# Patient Record
Sex: Female | Born: 1970 | Race: White | Hispanic: No | Marital: Married | Smoking: Never smoker
Health system: Southern US, Community
[De-identification: ages and names within clinical notes are randomized; demographics above are authoritative.]

## PROBLEM LIST (undated history)

## (undated) DIAGNOSIS — I10 Essential (primary) hypertension: Secondary | ICD-10-CM

## (undated) DIAGNOSIS — R011 Cardiac murmur, unspecified: Secondary | ICD-10-CM

## (undated) HISTORY — DX: Essential (primary) hypertension: I10

## (undated) HISTORY — PX: TUBAL LIGATION: SHX77

## (undated) HISTORY — PX: ABDOMINAL HYSTERECTOMY: SHX81

## (undated) HISTORY — DX: Cardiac murmur, unspecified: R01.1

---

## 2011-01-15 ENCOUNTER — Inpatient Hospital Stay (INDEPENDENT_AMBULATORY_CARE_PROVIDER_SITE_OTHER)
Admission: RE | Admit: 2011-01-15 | Discharge: 2011-01-15 | Disposition: A | Discharge status: Home / Self Care | Payer: PRIVATE HEALTH INSURANCE | Source: Ambulatory Visit | Attending: Emergency Medicine | Admitting: Emergency Medicine

## 2011-01-15 ENCOUNTER — Ambulatory Visit (INDEPENDENT_AMBULATORY_CARE_PROVIDER_SITE_OTHER): Payer: PRIVATE HEALTH INSURANCE

## 2011-01-15 DIAGNOSIS — W19XXXA Unspecified fall, initial encounter: Secondary | ICD-10-CM

## 2011-01-15 DIAGNOSIS — S9000XA Contusion of unspecified ankle, initial encounter: Secondary | ICD-10-CM

## 2011-01-15 DIAGNOSIS — S9030XA Contusion of unspecified foot, initial encounter: Secondary | ICD-10-CM

## 2012-09-19 ENCOUNTER — Other Ambulatory Visit: Payer: Self-pay | Admitting: Family Medicine

## 2012-09-19 DIAGNOSIS — N6489 Other specified disorders of breast: Secondary | ICD-10-CM

## 2012-10-11 ENCOUNTER — Ambulatory Visit
Admission: RE | Admit: 2012-10-11 | Discharge: 2012-10-11 | Disposition: A | Discharge status: Home / Self Care | Payer: Self-pay | Source: Ambulatory Visit | Attending: Family Medicine | Admitting: Family Medicine

## 2012-10-11 DIAGNOSIS — N6489 Other specified disorders of breast: Secondary | ICD-10-CM

## 2013-09-27 ENCOUNTER — Ambulatory Visit: Payer: Self-pay | Admitting: Cardiology

## 2013-12-06 ENCOUNTER — Ambulatory Visit: Payer: Self-pay | Admitting: Cardiology

## 2013-12-08 ENCOUNTER — Encounter: Payer: Self-pay | Admitting: *Deleted

## 2013-12-11 ENCOUNTER — Encounter (INDEPENDENT_AMBULATORY_CARE_PROVIDER_SITE_OTHER): Payer: Self-pay

## 2013-12-11 ENCOUNTER — Ambulatory Visit (INDEPENDENT_AMBULATORY_CARE_PROVIDER_SITE_OTHER): Payer: 59 | Admitting: Cardiology

## 2013-12-11 ENCOUNTER — Encounter: Payer: Self-pay | Admitting: Cardiology

## 2013-12-11 ENCOUNTER — Encounter: Payer: Self-pay | Admitting: *Deleted

## 2013-12-11 VITALS — BP 146/97 | HR 66 | Ht 67.0 in | Wt 253.0 lb

## 2013-12-11 DIAGNOSIS — R079 Chest pain, unspecified: Secondary | ICD-10-CM | POA: Insufficient documentation

## 2013-12-11 DIAGNOSIS — I10 Essential (primary) hypertension: Secondary | ICD-10-CM | POA: Insufficient documentation

## 2013-12-11 NOTE — Assessment & Plan Note (Signed)
Blood pressure mildly elevated. She will follow this and fu with primary care concerning this issuse. We discussed lifestyle modification including weight loss, diet and exercise.

## 2013-12-11 NOTE — Patient Instructions (Signed)
Your physician recommends that you schedule a follow-up appointment in: AS NEEDED  Your physician has requested that you have a stress echocardiogram. For further information please visit [email or web address]. Please follow instruction sheet as given.   LIST OF PRIMARY CARE DOCTORS

## 2013-12-11 NOTE — Progress Notes (Signed)
     HPI: 43 year old female for evaluation of chest pain. No prior cardiac history other than murmur as a child. Recently she has had occasional left upper extremity pain/numbness. It is not exertional. She has some dyspnea on exertion but no orthopnea or PND. Occasional minimal pedal edema when she is on her feet for 12 hours. No exertional chest pain. Cardiology asked to evaluate.  No current outpatient prescriptions on file.   No current facility-administered medications for this visit.    No Known Allergies  Past Medical History  Diagnosis Date  . Diabetes mellitus   . Hypertension   . Murmur     Past Surgical History  Procedure Laterality Date  . Tubal ligation    . Abdominal hysterectomy      History   Social History  . Marital Status: Married    Spouse Name: N/A    Number of Children: 2  . Years of Education: N/A   Occupational History  .  [employer]    Registered nurse   Social History Main Topics  . Smoking status: Never Smoker   . Smokeless tobacco: Not on file  . Alcohol Use: Yes     Comment: Occasional  . Drug Use: Not on file  . Sexual Activity: Not on file   Other Topics Concern  . Not on file   Social History Narrative  . No narrative on file    Family History  Problem Relation Age of Onset  . Pancreatic cancer Father   . Hypertension Father   . Hyperlipidemia Father     ROS: no fevers or chills, productive cough, hemoptysis, dysphasia, odynophagia, melena, hematochezia, dysuria, hematuria, rash, seizure activity, orthopnea, PND, pedal edema, claudication. Remaining systems are negative.  Physical Exam:   Blood pressure 146/97, pulse 66, height 5\' 7"  (1.702 m), weight 253 lb (114.76 kg).  General:  Well developed/obese in NAD Skin warm/dry Patient not depressed No peripheral clubbing Back-normal HEENT-normal/normal eyelids Neck supple/normal carotid upstroke bilaterally; no bruits; no JVD; no thyromegaly chest - CTA/ normal  expansion CV - RRR/normal S1 and S2; no murmurs, rubs or gallops;  PMI nondisplaced Abdomen -NT/ND, no HSM, no mass, + bowel sounds, no bruit 2+ femoral pulses, no bruits Ext-no edema, chords, 2+ DP Neuro-grossly nonfocal  ECG sinus rhythm at a rate of 66. Nonspecific ST changes. Right axis deviation.

## 2013-12-11 NOTE — Assessment & Plan Note (Signed)
Symptoms atypical. Multiple risk factors. Plan stress echocardiogram for risk stratification.

## 2014-01-12 ENCOUNTER — Other Ambulatory Visit: Payer: Self-pay

## 2014-01-12 ENCOUNTER — Ambulatory Visit (HOSPITAL_COMMUNITY): Payer: 59 | Attending: Cardiology

## 2014-01-12 ENCOUNTER — Ambulatory Visit (HOSPITAL_COMMUNITY): Payer: 59

## 2014-01-12 ENCOUNTER — Telehealth: Payer: Self-pay

## 2014-01-12 ENCOUNTER — Encounter: Payer: Self-pay | Admitting: Cardiology

## 2014-01-12 MED ORDER — LISINOPRIL 10 MG PO TABS: 10.0000 mg | ORAL_TABLET | Freq: Every day | ORAL | Status: AC

## 2014-01-12 NOTE — Progress Notes (Signed)
A stress echo was scheduled on the patient after she was seen in the office by Dr. Jens Somrenshaw. Unfortunately she is hypertensive today. She has not slept well. We are looking into making arrangements for her blood pressure treatment. If she has a primary physician and will be handled through her primary doctor. If not we will start low-dose ACE inhibitor. Her stress echo will then be done with plans for followup with Dr. Jens Somrenshaw to be sure about the results of her study and to follow up her blood pressure.  Jerral BonitoJeff Satoru Milich, MD

## 2014-01-12 NOTE — Telephone Encounter (Signed)
The pt was in office today for Stress echo and her BP was elevated. Per Dr Myrtis SerKatz, DOD, the pt was advised to start taking Lisinopril 10 mg daily. The pt verbalized understanding and agrees with plan. [facility] to fill per pt request.

## 2014-01-18 ENCOUNTER — Other Ambulatory Visit: Payer: Self-pay

## 2014-01-18 DIAGNOSIS — Z1231 Encounter for screening mammogram for malignant neoplasm of breast: Secondary | ICD-10-CM

## 2014-02-02 ENCOUNTER — Other Ambulatory Visit (HOSPITAL_COMMUNITY): Payer: 59

## 2014-02-05 ENCOUNTER — Ambulatory Visit: Payer: Self-pay | Admitting: Physician Assistant

## 2014-02-08 ENCOUNTER — Ambulatory Visit
Admission: RE | Admit: 2014-02-08 | Discharge: 2014-02-08 | Disposition: A | Discharge status: Home / Self Care | Payer: 59 | Source: Ambulatory Visit

## 2014-02-08 DIAGNOSIS — Z1231 Encounter for screening mammogram for malignant neoplasm of breast: Secondary | ICD-10-CM

## 2015-02-22 ENCOUNTER — Other Ambulatory Visit: Payer: Self-pay

## 2015-02-22 DIAGNOSIS — Z1231 Encounter for screening mammogram for malignant neoplasm of breast: Secondary | ICD-10-CM

## 2015-03-13 ENCOUNTER — Encounter (INDEPENDENT_AMBULATORY_CARE_PROVIDER_SITE_OTHER): Payer: Self-pay

## 2015-03-13 ENCOUNTER — Ambulatory Visit
Admission: RE | Admit: 2015-03-13 | Discharge: 2015-03-13 | Disposition: A | Discharge status: Home / Self Care | Payer: 59 | Source: Ambulatory Visit

## 2015-03-13 DIAGNOSIS — Z1231 Encounter for screening mammogram for malignant neoplasm of breast: Secondary | ICD-10-CM

## 2015-07-20 ENCOUNTER — Encounter (HOSPITAL_BASED_OUTPATIENT_CLINIC_OR_DEPARTMENT_OTHER): Payer: Self-pay | Admitting: Emergency Medicine

## 2015-07-20 ENCOUNTER — Emergency Department (HOSPITAL_BASED_OUTPATIENT_CLINIC_OR_DEPARTMENT_OTHER)
Admission: EM | Admit: 2015-07-20 | Discharge: 2015-07-20 | Disposition: A | Discharge status: Home / Self Care | Payer: 59 | Attending: Emergency Medicine | Admitting: Emergency Medicine

## 2015-07-20 DIAGNOSIS — Y288XXA Contact with other sharp object, undetermined intent, initial encounter: Secondary | ICD-10-CM | POA: Diagnosis not present

## 2015-07-20 DIAGNOSIS — Y998 Other external cause status: Secondary | ICD-10-CM | POA: Diagnosis not present

## 2015-07-20 DIAGNOSIS — Y9289 Other specified places as the place of occurrence of the external cause: Secondary | ICD-10-CM | POA: Insufficient documentation

## 2015-07-20 DIAGNOSIS — I10 Essential (primary) hypertension: Secondary | ICD-10-CM | POA: Diagnosis not present

## 2015-07-20 DIAGNOSIS — Y9389 Activity, other specified: Secondary | ICD-10-CM | POA: Insufficient documentation

## 2015-07-20 DIAGNOSIS — R011 Cardiac murmur, unspecified: Secondary | ICD-10-CM | POA: Diagnosis not present

## 2015-07-20 DIAGNOSIS — S61412A Laceration without foreign body of left hand, initial encounter: Secondary | ICD-10-CM | POA: Diagnosis present

## 2015-07-20 DIAGNOSIS — Z79899 Other long term (current) drug therapy: Secondary | ICD-10-CM | POA: Insufficient documentation

## 2015-07-20 MED ORDER — CEPHALEXIN 500 MG PO CAPS: 500.0000 mg | ORAL_CAPSULE | Freq: Two times a day (BID) | ORAL | Status: AC

## 2015-07-20 MED ORDER — LIDOCAINE-EPINEPHRINE (PF) 2 %-1:200000 IJ SOLN
10.0000 mL | Freq: Once | INTRAMUSCULAR | Status: AC
  Administered 2015-07-20: 10 mL
  Filled 2015-07-20: qty 10

## 2015-07-20 MED ORDER — CEPHALEXIN 250 MG PO CAPS
500.0000 mg | ORAL_CAPSULE | Freq: Once | ORAL | Status: AC
  Administered 2015-07-20: 500 mg via ORAL
  Filled 2015-07-20: qty 2

## 2015-07-20 MED ORDER — TETANUS-DIPHTH-ACELL PERTUSSIS 5-2.5-18.5 LF-MCG/0.5 IM SUSP
0.5000 mL | Freq: Once | INTRAMUSCULAR | Status: AC
  Administered 2015-07-20: 0.5 mL via INTRAMUSCULAR
  Filled 2015-07-20: qty 0.5

## 2015-07-20 NOTE — Discharge Instructions (Signed)
Keep wound dry and do not remove dressing for 24 hours if possible. After that, wash gently morning and night (every 12 hours) with soap and water. Use a topical antibiotic ointment and cover with a bandaid or gauze.  °  °Do NOT use rubbing alcohol or hydrogen peroxide, do not soak the area °  °Present to your primary care doctor or the urgent care of your choice, or the ED for suture removal in 7-10 days. °  °Every attempt was made to remove foreign body (contaminants) from the wound.  However, there is always a chance that some may remain in the wound. This can  increase your risk of infection. °  °If you see signs of infection (warmth, redness, tenderness, pus, sharp increase in pain, fever, red streaking in the skin) immediately return to the emergency department. °  °After the wound heals fully, apply sunscreen for 6-12 months to minimize scarring.  ° ° °Laceration Care, Adult °A laceration is a cut or lesion that goes through all layers of the skin and into the tissue just beneath the skin. °TREATMENT  °Some lacerations may not require closure. Some lacerations may not be able to be closed due to an increased risk of infection. It is important to see your caregiver as soon as possible after an injury to minimize the risk of infection and maximize the opportunity for successful closure. °If closure is appropriate, pain medicines may be given, if needed. The wound will be cleaned to help prevent infection. Your caregiver will use stitches (sutures), staples, wound glue (adhesive), or skin adhesive strips to repair the laceration. These tools bring the skin edges together to allow for faster healing and a better cosmetic outcome. However, all wounds will heal with a scar. Once the wound has healed, scarring can be minimized by covering the wound with sunscreen during the day for 1 full year. °HOME CARE INSTRUCTIONS  °For sutures or staples: °· Keep the wound clean and dry. °· If you were given a bandage  (dressing), you should change it at least once a day. Also, change the dressing if it becomes wet or dirty, or as directed by your caregiver. °· Wash the wound with soap and water 2 times a day. Rinse the wound off with water to remove all soap. Pat the wound dry with a clean towel. °· After cleaning, apply a thin layer of the antibiotic ointment as recommended by your caregiver. This will help prevent infection and keep the dressing from sticking. °· You may shower as usual after the first 24 hours. Do not soak the wound in water until the sutures are removed. °· Only take over-the-counter or prescription medicines for pain, discomfort, or fever as directed by your caregiver. °· Get your sutures or staples removed as directed by your caregiver. °For skin adhesive strips: °· Keep the wound clean and dry. °· Do not get the skin adhesive strips wet. You may bathe carefully, using caution to keep the wound dry. °· If the wound gets wet, pat it dry with a clean towel. °· Skin adhesive strips will fall off on their own. You may trim the strips as the wound heals. Do not remove skin adhesive strips that are still stuck to the wound. They will fall off in time. °For wound adhesive: °· You may briefly wet your wound in the shower or bath. Do not soak or scrub the wound. Do not swim. Avoid periods of heavy perspiration until the skin adhesive has fallen off   on its own. After showering or bathing, gently pat the wound dry with a clean towel. °· Do not apply liquid medicine, cream medicine, or ointment medicine to your wound while the skin adhesive is in place. This may loosen the film before your wound is healed. °· If a dressing is placed over the wound, be careful not to apply tape directly over the skin adhesive. This may cause the adhesive to be pulled off before the wound is healed. °· Avoid prolonged exposure to sunlight or tanning lamps while the skin adhesive is in place. Exposure to ultraviolet light in the first  year will darken the scar. °· The skin adhesive will usually remain in place for 5 to 10 days, then naturally fall off the skin. Do not pick at the adhesive film. °You may need a tetanus shot if: °· You cannot remember when you had your last tetanus shot. °· You have never had a tetanus shot. °If you get a tetanus shot, your arm may swell, get red, and feel warm to the touch. This is common and not a problem. If you need a tetanus shot and you choose not to have one, there is a rare chance of getting tetanus. Sickness from tetanus can be serious. °SEEK MEDICAL CARE IF:  °· You have redness, swelling, or increasing pain in the wound. °· You see a red line that goes away from the wound. °· You have yellowish-white fluid (pus) coming from the wound. °· You have a fever. °· You notice a bad smell coming from the wound or dressing. °· Your wound breaks open before or after sutures have been removed. °· You notice something coming out of the wound such as wood or glass. °· Your wound is on your hand or foot and you cannot move a finger or toe. °SEEK IMMEDIATE MEDICAL CARE IF:  °· Your pain is not controlled with prescribed medicine. °· You have severe swelling around the wound causing pain and numbness or a change in color in your arm, hand, leg, or foot. °· Your wound splits open and starts bleeding. °· You have worsening numbness, weakness, or loss of function of any joint around or beyond the wound. °· You develop painful lumps near the wound or on the skin anywhere on your body. °MAKE SURE YOU:  °· Understand these instructions. °· Will watch your condition. °· Will get help right away if you are not doing well or get worse. °Document Released: 10/26/2005 Document Revised: 01/18/2012 Document Reviewed: 04/21/2011 °ExitCare® Patient Information ©2015 [employer]. This information is not intended to replace advice given to you by your health care provider. Make sure you discuss any questions you have with your health  care provider. ° ° °

## 2015-07-20 NOTE — ED Provider Notes (Signed)
CSN: [id number]     Arrival date & time 07/20/15  1615 History   First MD Initiated Contact with Patient 07/20/15 1816     Chief Complaint  Patient presents with  . Extremity Laceration     (Consider location/radiation/quality/duration/timing/severity/associated sxs/prior Treatment) HPI  Blood pressure 159/102, pulse 77, temperature 98.8 F (37.1 C), temperature source Oral, resp. rate 18, height  (1.676 m), weight 240 lb (108.863 kg), SpO2 100 %.  Ashley Warner is a 44 y.o. female complaining of laceration to volar aspect of left hand on the second metacarpal and proximal phalanx. Patient was using an Publishing rights manager, the hedge trimmer fell out of her hand and she grabbed out to catch it. States her last tetanus shot was in 2007. She denies significant pain, numbness, reduced range of motion.  Past Medical History  Diagnosis Date  . Hypertension   . Murmur    Past Surgical History  Procedure Laterality Date  . Tubal ligation    . Abdominal hysterectomy     Family History  Problem Relation Age of Onset  . Pancreatic cancer Father   . Hypertension Father   . Hyperlipidemia Father    Social History  Substance Use Topics  . Smoking status: Never Smoker   . Smokeless tobacco: None  . Alcohol Use: Yes     Comment: Occasional   OB History    No data available     Review of Systems  10 systems reviewed and found to be negative, except as noted in the HPI.  Allergies  Review of patient's allergies indicates no known allergies.  Home Medications   Prior to Admission medications   Medication Sig Start Date End Date Taking? Authorizing Provider  lisinopril (PRINIVIL,ZESTRIL) 10 MG tablet Take 1 tablet (10 mg total) by mouth daily. 01/12/14   Luis Abed, MD   BP 159/102 mmHg  Pulse 77  Temp(Src) 98.8 F (37.1 C) (Oral)  Resp 18  Ht  (1.676 m)  Wt 240 lb (108.863 kg)  BMI 38.76 kg/m2  SpO2 100% Physical Exam  Constitutional: She is  oriented to person, place, and time. She appears well-developed and well-nourished. No distress.  HENT:  Head: Normocephalic.  Eyes: Conjunctivae and EOM are normal.  Cardiovascular: Normal rate, regular rhythm and intact distal pulses.   Pulmonary/Chest: Effort normal. No stridor.  Musculoskeletal: Normal range of motion.  Neurological: She is alert and oriented to person, place, and time.  Skin:  1.5 cm Irregular stellate laceration to distal volar left second metacarpal patient also has a 7 mm clean laceration to the middle phalanx. Bleeding is controlled, there is no gross contamination distally neurovascular intact, patient has full strength to each interphalangeal joint and metacarpal joint tested in isolation.   Psychiatric: She has a normal mood and affect.  Nursing note and vitals reviewed.   ED Course  LACERATION REPAIR Date/Time: 07/20/2015 7:20 PM Performed by: Wynetta Emery Authorized by: Wynetta Emery Consent: Verbal consent obtained. Risks and benefits: risks, benefits and alternatives were discussed Required items: required blood products, implants, devices, and special equipment available Time out: Immediately prior to procedure a "time out" was called to verify the correct patient, procedure, equipment, support staff and site/side marked as required. Body area: upper extremity Location details: left hand Laceration length: 1.5 cm Foreign bodies: no foreign bodies Tendon involvement: none Nerve involvement: none Vascular damage: no Anesthesia: local infiltration Local anesthetic: lidocaine 2% with epinephrine Anesthetic total: 3 ml Patient sedated: no  Preparation: Patient was prepped and draped in the usual sterile fashion. Irrigation solution: saline Irrigation method: syringe Debridement: minimal Degree of undermining: minimal Skin closure: Ethilon (4-0) Number of sutures: 4 Technique: simple (1 corner stich 3 simple interupted) Approximation:  close Approximation difficulty: complex Dressing: antibiotic ointment Patient tolerance: Patient tolerated the procedure well with no immediate complications   LACERATION REPAIR Date/Time: 07/20/2015 7:20 PM Performed by: Wynetta Emery Authorized by: Wynetta Emery Consent: Verbal consent obtained. Risks and benefits: risks, benefits and alternatives were discussed Required items: required blood products, implants, devices, and special equipment available Time out: Immediately prior to procedure a "time out" was called to verify the correct patient, procedure, equipment, support staff and site/side marked as required. Body area: upper extremity Location details: left 2nd digit volar middle phalanx Laceration length: 1 cm Foreign bodies: no foreign bodies Tendon involvement: none Nerve involvement: none Vascular damage: no Anesthesia: local infiltration Local anesthetic: lidocaine 2% with epinephrine Anesthetic total: 3 ml Patient sedated: no Preparation: Patient was prepped and draped in the usual sterile fashion. Irrigation solution: saline Irrigation method: syringe Debridement: minimal Degree of undermining: minimal Skin closure: Ethilon (4-0) Number of sutures: 2 Technique: simple  Approximation: close Approximation difficulty: complex Dressing: antibiotic ointment Patient tolerance: Patient tolerated the procedure well with no immediate complications  Labs Review Labs Reviewed - No data to display  Imaging Review No results found. I have personally reviewed and evaluated these images and lab results as part of my medical decision-making.   EKG Interpretation None      MDM   Final diagnoses:  Hand laceration, left, initial encounter    Filed Vitals:   07/20/15 1628  BP: 159/102  Pulse: 77  Temp: 98.8 F (37.1 C)  TempSrc: Oral  Resp: 18  Height: 5\' 6"  (1.676 m)  Weight: 240 lb (108.863 kg)  SpO2: 100%    Medications   lidocaine-EPINEPHrine (XYLOCAINE W/EPI) 2 %-1:200000 (PF) injection 10 mL (10 mLs Infiltration Given 07/20/15 1915)  cephALEXin (KEFLEX) capsule 500 mg (500 mg Oral Given 07/20/15 1913)  Tdap (BOOSTRIX) injection 0.5 mL (0.5 mLs Intramuscular Given 07/20/15 1913)    Ashley Warner is a pleasant 44 y.o. female presenting with irregular laceration to volar left palm and small laceration to left second digit middle phalanx. No nerve or tendon involvement. Patient's tetanus shot is updated. Of start abdomen and extensive irrigation of the wound and closed it, we've discussed wound care and return precautions.  Evaluation does not show pathology that would require ongoing emergent intervention or inpatient treatment. Pt is hemodynamically stable and mentating appropriately. Discussed findings and plan with patient/guardian, who agrees with care plan. All questions answered. Return precautions discussed and outpatient follow up given.   Discharge Medication List as of 07/20/2015  7:10 PM    START taking these medications   Details  cephALEXin (KEFLEX) 500 MG capsule Take 1 capsule (500 mg total) by mouth 2 (two) times daily., Starting 07/20/2015, Until Discontinued, Print             Wynetta Emery, PA-C 07/20/15 1928  Glynn Octave, MD 07/21/15 [phone]

## 2015-07-20 NOTE — ED Notes (Signed)
Pt cut hand on hedge trimmer...trimmer was falling and caught it with her left hand.  Small but deep lacerations noted to left palm of hand and left 2nd finger.  Active bleeding controlled with pressure.

## 2015-11-05 ENCOUNTER — Emergency Department
Admission: EM | Admit: 2015-11-05 | Discharge: 2015-11-05 | Disposition: A | Discharge status: Home / Self Care | Payer: 59 | Source: Home / Self Care | Attending: Family Medicine | Admitting: Family Medicine

## 2015-11-05 ENCOUNTER — Encounter: Payer: Self-pay | Admitting: Emergency Medicine

## 2015-11-05 DIAGNOSIS — J029 Acute pharyngitis, unspecified: Secondary | ICD-10-CM

## 2015-11-05 DIAGNOSIS — R059 Cough, unspecified: Secondary | ICD-10-CM

## 2015-11-05 DIAGNOSIS — H9202 Otalgia, left ear: Secondary | ICD-10-CM | POA: Diagnosis not present

## 2015-11-05 DIAGNOSIS — R05 Cough: Secondary | ICD-10-CM

## 2015-11-05 LAB — POCT RAPID STREP A (OFFICE): Rapid Strep A Screen: NEGATIVE

## 2015-11-05 MED ORDER — AMOXICILLIN-POT CLAVULANATE 875-125 MG PO TABS: 1.0000 | ORAL_TABLET | Freq: Two times a day (BID) | ORAL | Status: AC

## 2015-11-05 NOTE — ED Provider Notes (Signed)
CSN: [id number]     Arrival date & time 11/05/15  1302 History   First MD Initiated Contact with Patient 11/05/15 1338     Chief Complaint  Patient presents with  . Sore Throat   (Consider location/radiation/quality/duration/timing/severity/associated sxs/prior Treatment) HPI  Pt is a 44yo female presenting to [facility] with c/o worsening sore throat for 6 days with associated dry intermittent cough that started 2 days ago. Pt also c/o mild to moderately aching Left ear pain that started 4-5 days ago. She has been taking Mucinex, using throat lozenges and chloraseptic spray with temporary relief.  Denies fever, chills, n/v/d. Pt states her friend is also sick but no known exposure to strep throat. Pt states she gets strep throat annually. Denies difficulty breathing or swallowing.   Past Medical History  Diagnosis Date  . Hypertension   . Murmur    Past Surgical History  Procedure Laterality Date  . Tubal ligation    . Abdominal hysterectomy     Family History  Problem Relation Age of Onset  . Pancreatic cancer Father   . Hypertension Father   . Hyperlipidemia Father    Social History  Substance Use Topics  . Smoking status: Never Smoker   . Smokeless tobacco: None  . Alcohol Use: Yes     Comment: Occasional   OB History    No data available     Review of Systems  Constitutional: Positive for chills. Negative for fever.  HENT: Positive for congestion, ear pain (left) and sore throat. Negative for trouble swallowing and voice change.   Respiratory: Positive for cough. Negative for shortness of breath.   Cardiovascular: Negative for chest pain and palpitations.  Gastrointestinal: Negative for nausea, vomiting, abdominal pain and diarrhea.  Musculoskeletal: Negative for myalgias, back pain and arthralgias.  Skin: Negative for rash.    Allergies  Review of patient's allergies indicates no known allergies.  Home Medications   Prior to Admission medications   Medication Sig  Start Date End Date Taking? Authorizing Provider  amoxicillin-clavulanate (AUGMENTIN) 875-125 MG tablet Take 1 tablet by mouth 2 (two) times daily. One po bid x 7 days 11/05/15   Junius Finner, PA-C  cephALEXin (KEFLEX) 500 MG capsule Take 1 capsule (500 mg total) by mouth 2 (two) times daily. 07/20/15   Nicole Pisciotta, PA-C  lisinopril (PRINIVIL,ZESTRIL) 10 MG tablet Take 1 tablet (10 mg total) by mouth daily. 01/12/14   Luis Abed, MD   Meds Ordered and Administered this Visit  Medications - No data to display  BP 132/78 mmHg  Pulse 88  Temp(Src) 98.3 F (36.8 C) (Oral)  Resp 16  Ht  (1.676 m)  Wt 255 lb (115.667 kg)  BMI 41.18 kg/m2  SpO2 98% No data found.   Physical Exam  Constitutional: She appears well-developed and well-nourished. No distress.  HENT:  Head: Normocephalic and atraumatic.  Right Ear: Hearing, external ear and ear canal normal. A middle ear effusion is present.  Left Ear: Hearing, tympanic membrane, external ear and ear canal normal.  Nose: Mucosal edema present.  Mouth/Throat: Uvula is midline and mucous membranes are normal. Posterior oropharyngeal erythema present. No oropharyngeal exudate, posterior oropharyngeal edema or tonsillar abscesses.  Eyes: Conjunctivae are normal. No scleral icterus.  Neck: Normal range of motion. Neck supple.  Cardiovascular: Normal rate, regular rhythm and normal heart sounds.   Pulmonary/Chest: Effort normal and breath sounds normal. No respiratory distress. She has no wheezes. She has no rales. She exhibits no tenderness.  Abdominal: Soft. She exhibits no distension and no mass. There is no tenderness. There is no rebound and no guarding.  Musculoskeletal: Normal range of motion.  Neurological: She is alert.  Skin: Skin is warm and dry. She is not diaphoretic.  Nursing note and vitals reviewed.   ED Course  Procedures (including critical care time)  Labs Review Labs Reviewed  POCT RAPID STREP A (OFFICE)     Imaging Review No results found.     MDM   1. Sore throat   2. Left ear pain   3. Cough    Pt c/o worsening sore throat and URI symptoms. No tonsillar abscess noted on exam.  Rapid strep: negative  Symptoms likely viral in nature. Encouraged pt to continue symptomatic treatment and include alternating acetaminophen and ibuprofen for fever or pain for 4-5 more days. Prescription for augmentin provided if symptoms continue to worsen. F/u with PCP in 7-10 days if not improving. Patient verbalized understanding and agreement with treatment plan.     Junius Finnerrin O'Malley, PA-C 11/05/15 1406

## 2015-11-05 NOTE — Discharge Instructions (Signed)
You may take 400-600mg  Ibuprofen (Motrin) every 6-8 hours for fever and pain  Alternate with Tylenol  You may take 500mg  Tylenol every 4-6 hours as needed for fever and pain  Follow-up with your primary care provider next week for recheck of symptoms if not improving.  Be sure to drink plenty of fluids and rest, at least 8hrs of sleep a night, preferably more while you are sick. Return urgent care or go to closest ER if you cannot keep down fluids/signs of dehydration, fever not reducing with Tylenol, difficulty breathing/wheezing, stiff neck, worsening condition, or other concerns (see below)   You likely have a virus.  Your rapid strep test was negative.  It will be sent off for a culture for further testing which should result in 2-3 days.  In the meantime, please use conservative treatment as explained above alternating tylenol and motrin for fever and pain.  It may also be helpful to stick to a soft diet of yogurt, jell-o, apple sauce, soups, and cool drinks and popsicles to help reduce throat pain.    If symptoms continue to worsening for 4-5 days, or you develop a fever for 3 consecutive days, you may fill the antibiotic prescription for augmentin.

## 2015-11-05 NOTE — ED Notes (Signed)
Patient states she has had a sore throat for 6 days; denies fever; states she gets Strep throat annually. No OTCs today.

## 2015-11-09 ENCOUNTER — Telehealth: Payer: Self-pay | Admitting: Emergency Medicine

## 2015-11-09 NOTE — ED Notes (Signed)
Inquired about patient's status; encourage them to call with questions/concerns.  

## 2016-05-28 DIAGNOSIS — L237 Allergic contact dermatitis due to plants, except food: Secondary | ICD-10-CM | POA: Diagnosis not present

## 2016-05-29 MED FILL — METHYLPREDNISOLONE 4 MG TAB: 4 | 6 days supply | Qty: 21 | Fill #0

## 2017-01-12 IMAGING — MG MM SCREENING BREAST TOMO BILATERAL
8 series · 8 of 24 positions shown · non-contrast
Comparison: Previous exam(s).

CLINICAL DATA: Screening.

EXAM:
DIGITAL SCREENING BILATERAL MAMMOGRAM WITH 3D TOMO WITH CAD

[L CC]
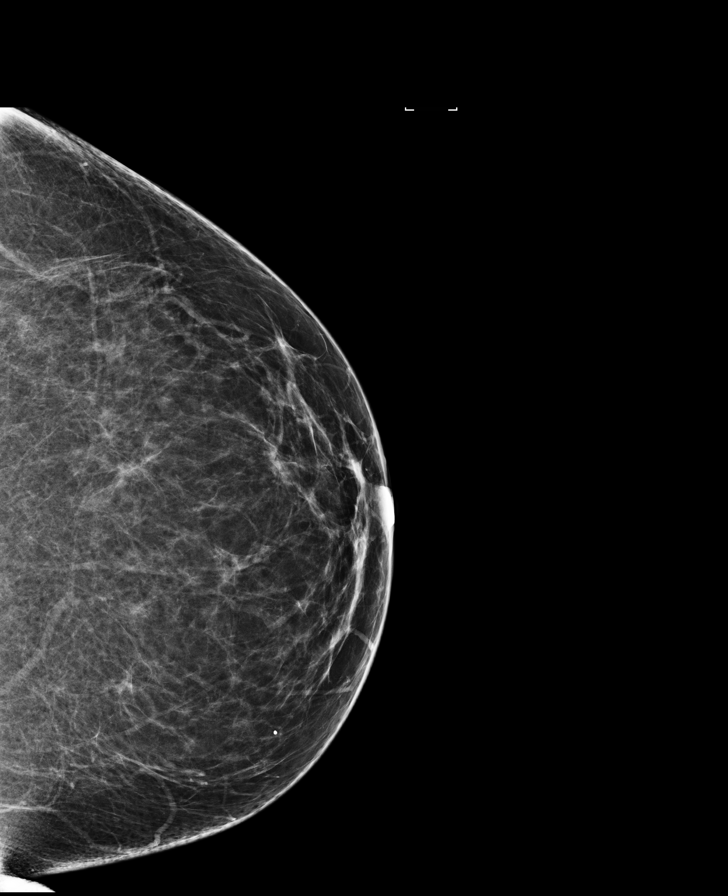

[R MLO]
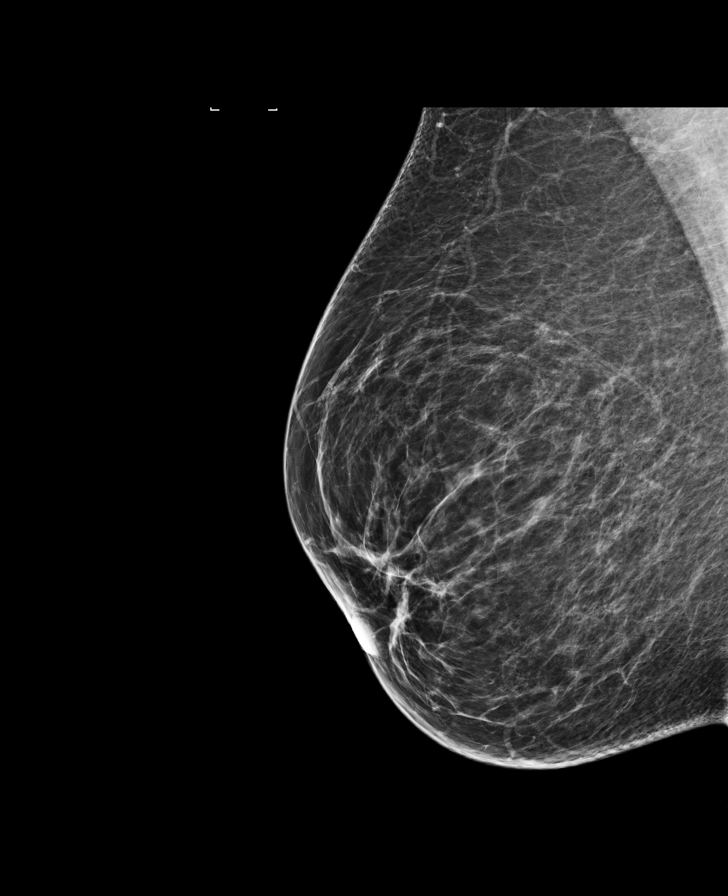

[L MLO]
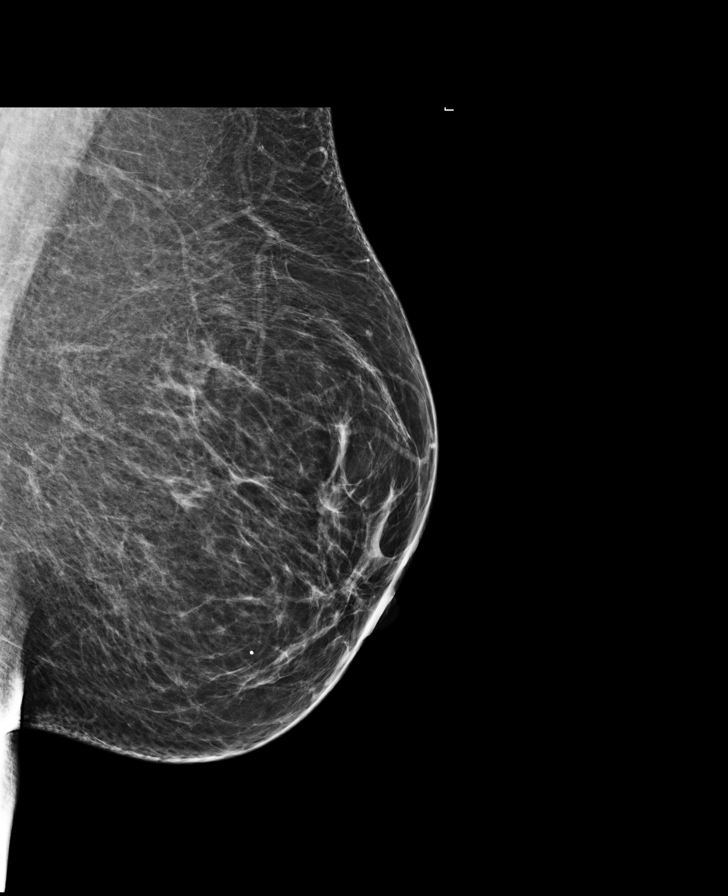

[R CC]
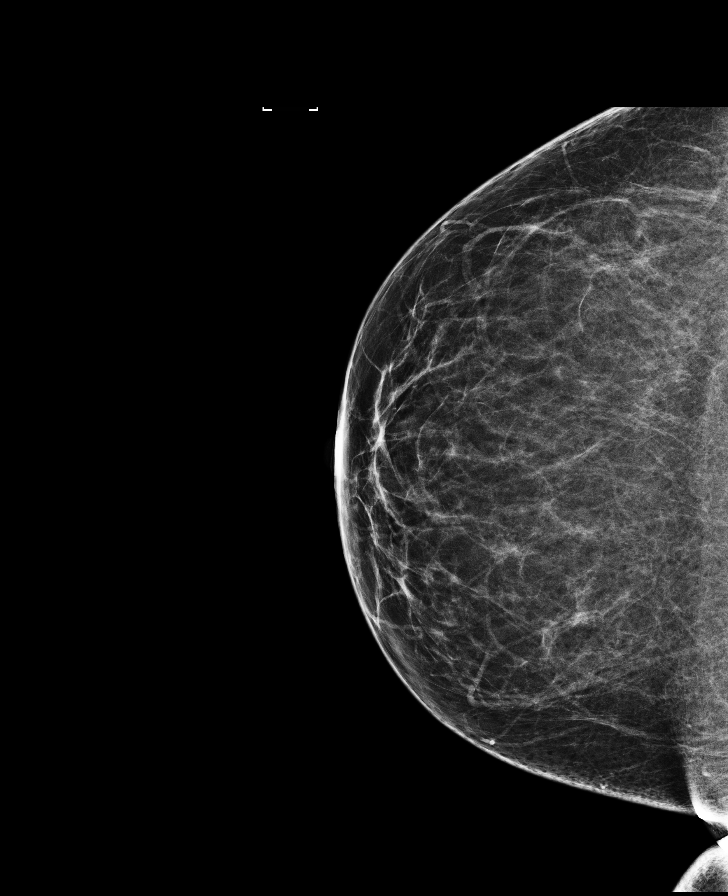

[L MLO tomo · tomo slice 41/81.0]
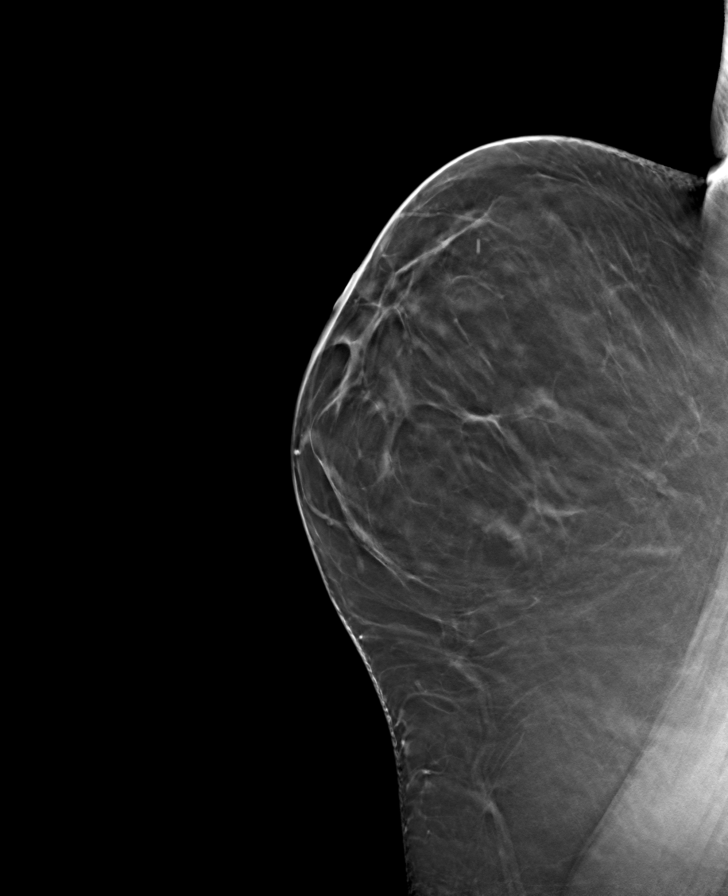

[R MLO tomo · tomo slice 41/80.0]
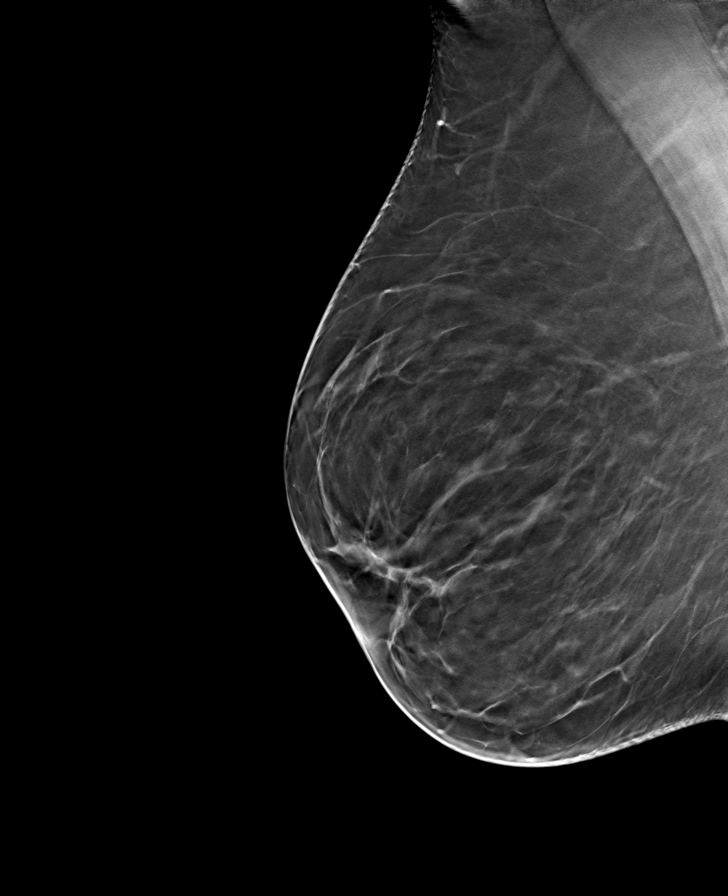

[L CC tomo · tomo slice 37/73.0]
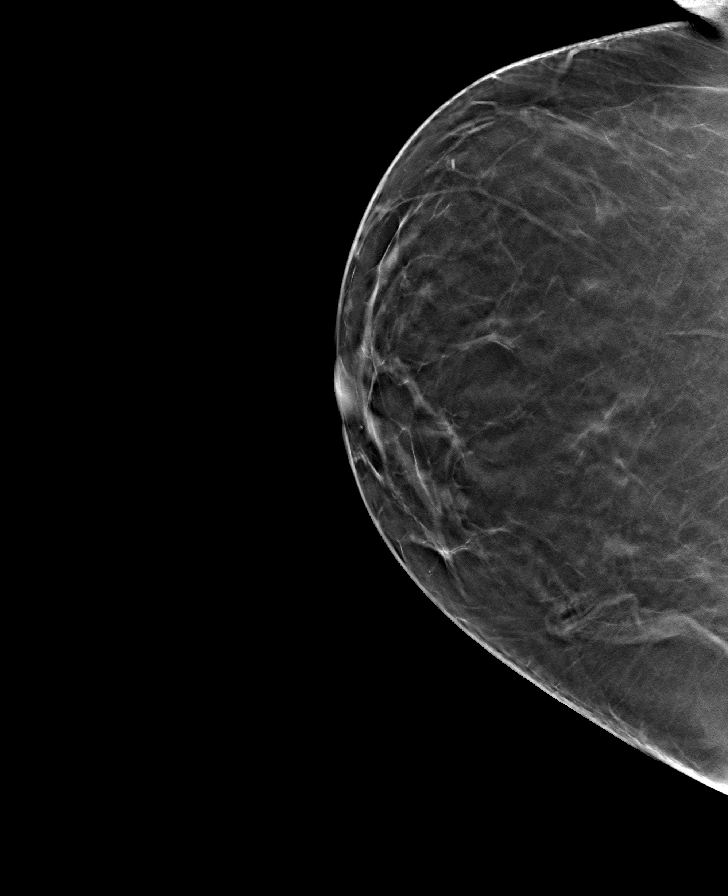

[R CC tomo · tomo slice 35/69.0]
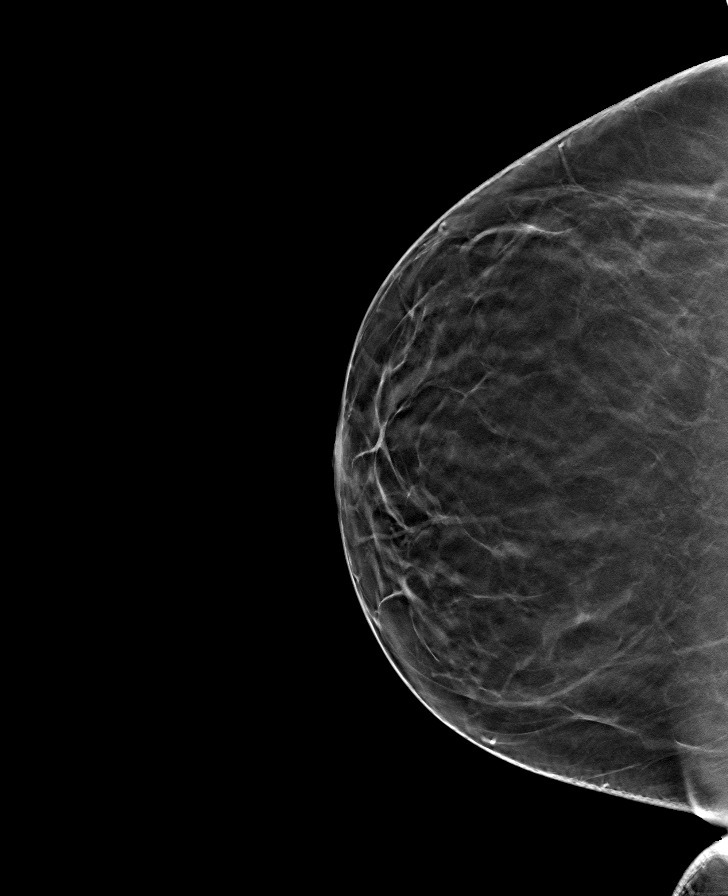

[8 of 24 positions shown; findings below may reference images not displayed]

ACR Breast Density Category b: There are scattered areas of
fibroglandular density.
FINDINGS: There are no findings suspicious for malignancy. Images were
processed with CAD.
IMPRESSION: No mammographic evidence of malignancy. A result letter of this
screening mammogram will be mailed directly to the patient.

RECOMMENDATION:
Screening mammogram in one year. (Code:55-L-23V)

BI-RADS CATEGORY  1: Negative.
